# Patient Record
Sex: Female | Born: 1950 | Race: White | Hispanic: No | Smoking: Former smoker
Health system: Southern US, Community
[De-identification: ages and names within clinical notes are randomized; demographics above are authoritative.]

## PROBLEM LIST (undated history)

## (undated) DIAGNOSIS — G8929 Other chronic pain: Secondary | ICD-10-CM

## (undated) DIAGNOSIS — K589 Irritable bowel syndrome without diarrhea: Secondary | ICD-10-CM

## (undated) DIAGNOSIS — K219 Gastro-esophageal reflux disease without esophagitis: Secondary | ICD-10-CM

## (undated) DIAGNOSIS — F419 Anxiety disorder, unspecified: Secondary | ICD-10-CM

## (undated) DIAGNOSIS — I10 Essential (primary) hypertension: Secondary | ICD-10-CM

## (undated) DIAGNOSIS — F988 Other specified behavioral and emotional disorders with onset usually occurring in childhood and adolescence: Secondary | ICD-10-CM

## (undated) DIAGNOSIS — R51 Headache: Secondary | ICD-10-CM

## (undated) DIAGNOSIS — R519 Headache, unspecified: Secondary | ICD-10-CM

## (undated) DIAGNOSIS — N3010 Interstitial cystitis (chronic) without hematuria: Secondary | ICD-10-CM

## (undated) DIAGNOSIS — D649 Anemia, unspecified: Secondary | ICD-10-CM

## (undated) DIAGNOSIS — E785 Hyperlipidemia, unspecified: Secondary | ICD-10-CM

## (undated) DIAGNOSIS — F329 Major depressive disorder, single episode, unspecified: Secondary | ICD-10-CM

## (undated) DIAGNOSIS — N390 Urinary tract infection, site not specified: Secondary | ICD-10-CM

## (undated) DIAGNOSIS — F32A Depression, unspecified: Secondary | ICD-10-CM

## (undated) DIAGNOSIS — F429 Obsessive-compulsive disorder, unspecified: Secondary | ICD-10-CM

## (undated) DIAGNOSIS — Z8659 Personal history of other mental and behavioral disorders: Secondary | ICD-10-CM

## (undated) DIAGNOSIS — J189 Pneumonia, unspecified organism: Secondary | ICD-10-CM

## (undated) HISTORY — DX: Interstitial cystitis (chronic) without hematuria: N30.10

## (undated) HISTORY — DX: Essential (primary) hypertension: I10

## (undated) HISTORY — PX: OTHER SURGICAL HISTORY: SHX169

## (undated) HISTORY — DX: Hyperlipidemia, unspecified: E78.5

## (undated) HISTORY — DX: Gastro-esophageal reflux disease without esophagitis: K21.9

## (undated) HISTORY — DX: Other chronic pain: G89.29

## (undated) HISTORY — DX: Major depressive disorder, single episode, unspecified: F32.9

## (undated) HISTORY — DX: Other specified behavioral and emotional disorders with onset usually occurring in childhood and adolescence: F98.8

## (undated) HISTORY — DX: Obsessive-compulsive disorder, unspecified: F42.9

## (undated) HISTORY — DX: Headache: R51

## (undated) HISTORY — DX: Headache, unspecified: R51.9

## (undated) HISTORY — PX: RHINOPLASTY: SUR1284

## (undated) HISTORY — DX: Anemia, unspecified: D64.9

## (undated) HISTORY — PX: MENISCUS REPAIR: SHX5179

## (undated) HISTORY — DX: Depression, unspecified: F32.A

## (undated) HISTORY — DX: Anxiety disorder, unspecified: F41.9

## (undated) HISTORY — DX: Personal history of other mental and behavioral disorders: Z86.59

## (undated) HISTORY — DX: Irritable bowel syndrome, unspecified: K58.9

## (undated) HISTORY — PX: TONSILLECTOMY: SUR1361

## (undated) HISTORY — DX: Pneumonia, unspecified organism: J18.9

## (undated) HISTORY — PX: ADENOIDECTOMY: SUR15

## (undated) HISTORY — PX: OSTEOTOMY: SHX137

## (undated) HISTORY — DX: Urinary tract infection, site not specified: N39.0

---

## 1982-03-27 HISTORY — PX: OTHER SURGICAL HISTORY: SHX169

## 2003-01-21 ENCOUNTER — Inpatient Hospital Stay (HOSPITAL_COMMUNITY): Admission: EM | Admit: 2003-01-21 | Discharge: 2003-01-24 | Payer: Self-pay | Admitting: Psychiatry

## 2010-08-12 NOTE — Discharge Summary (Signed)
NAMESAMHITA, KRETSCH                            ACCOUNT NO.:  [id number]   MEDICAL RECORD NO.:  [id number]                   PATIENT TYPE:  IPS   LOCATION:  0302                                 FACILITY:  [facility]   PHYSICIAN:  Geoffery Lyons, M.D.                   DATE OF BIRTH:  08-19-1950   DATE OF ADMISSION:  01/21/2003  DATE OF DISCHARGE:  01/24/2003                                 DISCHARGE SUMMARY   CHIEF COMPLAINT AND PRESENT ILLNESS:  This was the first admission to [facility] for this 60 year old white female, married,  involuntarily committed.  She had significant family stressors.  She claimed  she lost it, yelling and screaming at her son for piercing his eyebrow,  having one or two months of irritability and anger, helplessness,  hopelessness, temper outbursts.  Her best friend died of a heart attack on  01-04-2023 of this same year, her son had ADHD, and she was the only bread  winner.   PAST PSYCHIATRIC HISTORY:  This was the first time inpatient.   SUBSTANCE ABUSE HISTORY:  One to two drinks daily, no escalation.   PAST MEDICAL HISTORY:  1. Perimenopausal.  2. Interstitial cystitis.   MEDICATIONS:  1. Elmiron two capsules twice a day.  2. Neurontin 300 mg twice a day.  3. Vistaril 25 mg every six hours as needed.  4. Valium 5 mg twice a day.  5. Pyridium.   PHYSICAL EXAMINATION:  Physical examination was performed, failed to show  any acute findings.   MENTAL STATUS EXAM:  Mental status exam revealed a fully alert, cooperative,  pleasant female, tearful, fragile.  Speech: Normal tone and pace.  Mood:  Depressed, helpless, hopeless.  Affect: Depressed.  Stressed by the conflict  with the husband and her son.  Thought processes: Suicidal ideation, not  clear, no plan; no homicidal or suicidal ideas, no psychosis.  Good insight.  Judgment: Appropriate, intact.  Cognitive: Cognition was well preserved.   ADMISSION DIAGNOSES:   AXIS I:   Major depression, recurrent.   AXIS II:  No diagnosis.   AXIS III:  Interstitial cystis.   AXIS IV:  Moderate.   AXIS V:  Global assessment of functioning upon admission 38, highest global  assessment of functioning in the last year 75.   LABORATORY DATA:  CBC was within normal limits.  Blood chemistries were  within normal limits.  Thyroid profile was within normal limits.  Drug  screen: Positive for benzodiazepines.   HOSPITAL COURSE:  She was admitted and started in intensive individual and  group psychotherapy.  She was maintained on Ambien and she was maintained on  the Valium 5 mg twice a day as needed for anxiety.  She was started on  Lexapro 10 mg per day, had already started on Neurontin 300 mg twice a day  and Elmiron 100 mg two  twice a day.  She admitted to having been dealing  with multiple stressors, had been building up and lost it, more overwhelmed,  pressured, her job as a Education officer, museum, son with ADHD, acting out,  impulsive, chronic medical conditions.  There was a session with the  husband.  He admitted that they have financial stress due to back taxes.  Husband was in the pottery business but it was very slow.  On October 30,  she was in full contact with reality, encouraged by the way the session went  with the husband, no suicidal ideas, no homicidal ideas, no delusions, no  hallucinations, willing to continue to work on coping skills and stress  management, feeling more positive about her life.   DISCHARGE DIAGNOSES:   AXIS I:  Major depression, recurrent.   AXIS II:  No diagnosis.   AXIS III:  Interstitial cystitis.   AXIS IV:  Moderate.   AXIS V:  Global assessment of functioning upon discharge 55.   DISCHARGE MEDICATIONS:  1. Lexapro 10 mg per day.  2. Neurontin 300 mg twice a day.  3. Elmiron 100 mg two twice a day.  4. Valium 5 mg twice a day.  5. Ambien 10 mg at bedtime as needed for sleep.  6. Detrol LA 4 mg daily.  7. Urised one  tablet daily.   FOLLOW UP:  She was to follow up at [facility].                                               Geoffery Lyons, M.D.    IL/MEDQ  D:  02/18/2003  T:  02/19/2003  Job:  [id number]

## 2013-04-09 ENCOUNTER — Telehealth: Payer: Self-pay

## 2013-04-09 NOTE — Telephone Encounter (Signed)
Dr.White called the office.DOD call for Dr.Smith.pt schedule to be seen on 04/10/13 @9am  to discuss cardiac symptoms and possible cardiac cath.pt aware of appt

## 2013-04-10 ENCOUNTER — Encounter: Payer: Self-pay | Admitting: Interventional Cardiology

## 2013-04-10 ENCOUNTER — Ambulatory Visit (INDEPENDENT_AMBULATORY_CARE_PROVIDER_SITE_OTHER): Payer: BC Managed Care – PPO | Admitting: Interventional Cardiology

## 2013-04-10 VITALS — BP 130/90 | HR 82 | Ht 65.5 in | Wt 183.0 lb

## 2013-04-10 DIAGNOSIS — E785 Hyperlipidemia, unspecified: Secondary | ICD-10-CM

## 2013-04-10 DIAGNOSIS — R079 Chest pain, unspecified: Secondary | ICD-10-CM

## 2013-04-10 DIAGNOSIS — F329 Major depressive disorder, single episode, unspecified: Secondary | ICD-10-CM

## 2013-04-10 DIAGNOSIS — K219 Gastro-esophageal reflux disease without esophagitis: Secondary | ICD-10-CM

## 2013-04-10 MED ORDER — ROSUVASTATIN CALCIUM 20 MG PO TABS: 20.0000 mg | ORAL_TABLET | Freq: Every day | ORAL | Status: DC

## 2013-04-10 MED ORDER — ASPIRIN EC 81 MG PO TBEC: 81.0000 mg | DELAYED_RELEASE_TABLET | Freq: Every day | ORAL | Status: DC

## 2013-04-10 NOTE — Patient Instructions (Signed)
Start Crestor 20mg  every evening with dinner  Start Asprin 81mg  daily  Your physician has requested that you have en exercise stress myoview. For further information please visit [email or web address]. Please follow instruction sheet, as given.( To be scheduled asap)  You have been instructed on how and when to use Nitroglycerin  Call the if if chest pain reoccurs. If chest pain is persisted go to the Emergency Dept

## 2013-04-10 NOTE — Progress Notes (Signed)
Patient ID: Margaret Daniels, female   DOB: 10-03-1950, 63 y.o.   MRN: [id number]   Date: 04/10/2013 ID: Margaret Daniels, DOB 10-03-1950, MRN [id number] PCP: Everlean CherryWHYTE,THOMAS M, MD  Reason: Chest pain of anginal quality  ASSESSMENT;  1. Chest pressure, of anginal quality occurring at rest. Episode lasted less than 20 minutes. EKG at primary physician's office yesterday demonstrated T-wave inversion the one from the free. 2. History of gastroesophageal reflux 3. Hypercholesterolemia 4. History of depression and anxiety 5. T-wave abnormality on electrocardiogram V1 through V4 representing either baseline tracing versus ischemia  PLAN:  1. Stress Cardiolite study to risk stratify this patient who has had one chest pain episode of less than 20 minutes compatible with angina. Baseline EKG performed by her primary physician yesterday demonstrated T-wave inversion V1 through V4. I am concerned about the patient's history. Under the circumstances with chest pain lasting less than 20 minutes, catheterization seems too aggressive. I do want to perform a high yield study to exclude the possibility of myocardial ischemia in the LAD territory. Because of the EKG abnormalities and the history I will prefer to perform a myocardial perfusion study rather than a regular exercise treadmill test to ensure that we will not miss a significant problem in the LAD territory. 2. Start statin therapy in the form of Crestor 20 mg per day 3. Aspirin 81 mg per day 4. Sublingual nitroglycerin if recurrent chest discomfort   SUBJECTIVE: Margaret Daniels is a 63 y.o. 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 5 days ago while sitting at home she developed a sudden viselike pressure in her chest that radiated to her left jaw. There was mild diaphoresis. The discomfort intensity was quite significant and rated as 7/10. The discomfort lasted between 10 and 20 minutes before slowly resolving spontaneously.  Since that time she has had no recurrence. She mentioned this episode her primary physician yesterday who had her work in today for evaluation. Since the episode occurred 5 days ago she has had no recurrence. She has had no limitations in physical activity. She is not short of breath. There've been no palpitations. Overall she is felt relatively well and her endurance has not changed   Allergies  Allergen Reactions  . Codeine     Feeling that she can't breathe    No current outpatient prescriptions on file prior to visit.   No current facility-administered medications on file prior to visit.    Past Medical History  Diagnosis Date  . Obsessive compulsive disorder   . Esophageal reflux   . Chronic interstitial cystitis   . History of major depression   . Hyperlipidemia     Past Surgical History  Procedure Laterality Date  . Cesarean section    . Palpable fracture repair    . Tonsillectomy      History   Social History  . Marital Status: Single    Spouse Name: N/A    Number of Children: N/A  . Years of Education: N/A   Occupational History  . Not on file.   Social History Main Topics  . Smoking status: Former Games developermoker  . Smokeless tobacco: Not on file  . Alcohol Use: No  . Drug Use: No  . Sexual Activity: Not on file   Other Topics Concern  . Not on file   Social History Narrative  . No narrative on file    Family History  Problem Relation Age of Onset  . Pancreatic cancer Mother   .  Kidney failure Father   . Breast cancer Sister     ROS: Complains of feeling under stress and having financial difficulties.. Other systems negative for complaints.  OBJECTIVE: BP 130/90  Pulse 82  Ht 5' 5.5" (1.664 m)  Wt 183 lb (83.008 kg)  BMI 29.98 kg/m2,  General: No acute distress, moderate obesity  HEENT: normal  Neck: JVD flat. Carotids absent  Chest: Clear  Cardiac: Murmur: 1 of 6 systolic murmur at the apex. Gallop: S4 gallop is audible. Rhythm: Regular.  Other: No other abnormality  Abdomen: Bruit: Absent. Pulsation: Absent  Extremities: Edema: Absent. Pulses: 2+ bilateral  Neuro: Normal  Psych: Appears normally compensated  ECG: ECG today demonstrates normal sinus rhythm with T wave flattening throughout the precordium which is slightly improved compared to the EKG performed at her primary physician's office 18 hours ago. EKG today is closer to a nonspecific pattern rather than an ischemic pattern.

## 2013-04-11 ENCOUNTER — Ambulatory Visit (HOSPITAL_COMMUNITY)
Admission: RE | Admit: 2013-04-11 | Discharge: 2013-04-11 | Disposition: A | Discharge status: Home / Self Care | Payer: BC Managed Care – PPO | Source: Ambulatory Visit | Attending: Cardiovascular Disease | Admitting: Cardiovascular Disease

## 2013-04-11 DIAGNOSIS — R079 Chest pain, unspecified: Secondary | ICD-10-CM | POA: Insufficient documentation

## 2013-04-11 MED ORDER — TECHNETIUM TC 99M SESTAMIBI GENERIC - CARDIOLITE
10.3000 | Freq: Once | INTRAVENOUS | Status: AC | PRN
  Administered 2013-04-11: 10 via INTRAVENOUS

## 2013-04-11 MED ORDER — TECHNETIUM TC 99M SESTAMIBI GENERIC - CARDIOLITE
31.1000 | Freq: Once | INTRAVENOUS | Status: AC | PRN
  Administered 2013-04-11: 31.1 via INTRAVENOUS

## 2013-04-11 NOTE — Procedures (Addendum)
[facility] [place] [facility] [place] [facility] [address] [place] [address] [phone]  Cardiology Nuclear Med Study  Margaret Daniels is a 63 y.o. female     MRN : [id number]     DOB: 1950/06/04  Procedure Date: 04/11/2013  Nuclear Med Background Indication for Stress Test:  Evaluation for Ischemia History:  Emphysema Cardiac Risk Factors: History of Smoking, Lipids and Obesity  Symptoms:  Chest Pain and Fatigue   Nuclear Pre-Procedure Caffeine/Decaff Intake:  7:00pm NPO After: 5:00am   IV Site: R Antecubital  IV 0.9% NS with Angio Cath:  22g  Chest Size (in):  n/a IV Started by: Emmit PomfretAmanda Hicks, RN  Height: 5\' 6"  (1.676 m)  Cup Size: D  BMI:  Body mass index is 29.55 kg/(m^2). Weight:  183 lb (83.008 kg)   Tech Comments:  n/a    Nuclear Med Study 1 or 2 day study: 1 day  Stress Test Type:  Stress  Order Authorizing Provider:  Verdis PrimeHenry Smith, MD   Resting Radionuclide: Technetium 6719m Sestamibi  Resting Radionuclide Dose: 10.2 mCi   Stress Radionuclide:  Technetium 9219m Sestamibi  Stress Radionuclide Dose: 31.1 mCi           Stress Protocol Rest HR: 68 Stress HR: 153  Rest BP: 139/94 Stress BP: 209/98  Exercise Time (min): 8 METS: 10.1   Predicted Max HR: 158 bpm % Max HR: 96.84 bpm Rate Pressure Product: [id number]  Dose of Adenosine (mg):  n/a Dose of Lexiscan: n/a mg  Dose of Atropine (mg): n/a Dose of Dobutamine: n/a mcg/kg/min (at max HR)  Stress Test Technologist: Esperanza Sheetserry-Marie Martin, CCT Nuclear Technologist: Gonzella LexPam Phillips, CNMT   Rest Procedure:  Myocardial perfusion imaging was performed at rest 45 minutes following the intravenous administration of Technetium 7219m Sestamibi. Stress Procedure:  The patient performed treadmill exercise using a Bruce  Protocol for 8 minutes. The patient stopped due to SOB and fatigue and denied any chest pain.  There were no significant ST-T wave changes.  Technetium 119m Sestamibi was injected at peak exercise and  myocardial perfusion imaging was performed after a brief delay.  Transient Ischemic Dilatation (Normal <1.22):  0.75 Lung/Heart Ratio (Normal <0.45):  0.30 QGS EDV:  83 ml QGS ESV:  29 ml LV Ejection Fraction: 65%  Rest ECG: NSR - Normal EKG  Stress ECG: No significant ST segment change suggestive of ischemia.  QPS Raw Data Images:  There is interference from nuclear activity from structures below the diaphragm. This does not affect the ability to read the study. Stress Images:  Normal homogeneous uptake in all areas of the myocardium. Rest Images:  Normal homogeneous uptake in all areas of the myocardium. Subtraction (SDS):  No evidence of ischemia.  Impression Exercise Capacity:  Good exercise capacity. BP Response:  Normal blood pressure response. Clinical Symptoms:  No significant symptoms noted. ECG Impression:  No significant ST segment change suggestive of ischemia. Comparison with Prior Nuclear Study: No images to compare  Overall Impression:  Normal stress nuclear study. THR achieved. Good exercise tolerance. Hypertensive response to exercise.  LV Wall Motion:  NL LV Function; NL Wall Motion; EF 65%.  Chrystie NoseKenneth C. Daulton Harbaugh, MD, [facility] LLCFACC Board Certified in Nuclear Cardiology Attending Cardiologist [facility]  Chrystie NoseHILTY,[name], MD  04/11/2013 12:35 PM

## 2013-04-15 ENCOUNTER — Telehealth: Payer: Self-pay

## 2013-04-15 NOTE — Telephone Encounter (Signed)
pt given results of nuclear stress test.Let patient know that the stress test was normal. If there are recurrent symptoms, she should call immediately. Stay on medicine for cholesterol and baby aspirin. F/U with PCP ASAP to monitor cholesterol.results fwd to Dr.Whyte.pt verbalized understanding.

## 2013-04-15 NOTE — Telephone Encounter (Signed)
Message copied by Jarvis NewcomerPARRIS-GODLEY, Kayla Deshaies S on Tue Apr 15, 2013  3:47 PM ------      Message from: Verdis PrimeSMITH, HENRY      Created: Tue Apr 15, 2013  7:39 AM       Let patient know that the stress test was normal. If there are recurrent symptoms, she should call immediately. Stay on medicine for cholesterol and baby aspirin. F/U with PCP ASAP to monitor cholesterol.end copy of nuclear study to PCP. ------

## 2013-06-02 ENCOUNTER — Other Ambulatory Visit (HOSPITAL_COMMUNITY): Payer: Self-pay | Admitting: Psychiatry

## 2014-03-27 NOTE — Telephone Encounter (Signed)
Refill not appropriate. Not seen in outpatient. Cleaning out MD inbasket, request was faxed in March when request was sent.

## 2016-08-24 HISTORY — PX: COLONOSCOPY: SHX174

## 2016-09-25 ENCOUNTER — Other Ambulatory Visit: Payer: Self-pay | Admitting: Family Medicine

## 2016-09-25 DIAGNOSIS — R928 Other abnormal and inconclusive findings on diagnostic imaging of breast: Secondary | ICD-10-CM

## 2016-10-04 ENCOUNTER — Other Ambulatory Visit: Payer: Self-pay | Admitting: Family Medicine

## 2016-10-04 ENCOUNTER — Ambulatory Visit
Admission: RE | Admit: 2016-10-04 | Discharge: 2016-10-04 | Disposition: A | Discharge status: Home / Self Care | Payer: Medicare Other | Source: Ambulatory Visit | Attending: Family Medicine | Admitting: Family Medicine

## 2016-10-04 DIAGNOSIS — N63 Unspecified lump in unspecified breast: Secondary | ICD-10-CM

## 2016-10-04 DIAGNOSIS — R928 Other abnormal and inconclusive findings on diagnostic imaging of breast: Secondary | ICD-10-CM

## 2016-10-19 HISTORY — PX: ESOPHAGOGASTRODUODENOSCOPY: SHX1529

## 2016-10-25 HISTORY — PX: BREAST LUMPECTOMY: SHX2

## 2017-01-25 HISTORY — PX: APPENDECTOMY: SHX54

## 2017-10-25 ENCOUNTER — Encounter: Payer: Self-pay | Admitting: Gastroenterology

## 2017-12-03 ENCOUNTER — Encounter: Payer: Self-pay | Admitting: Gastroenterology

## 2017-12-28 ENCOUNTER — Encounter: Payer: Self-pay | Admitting: Gastroenterology

## 2017-12-31 ENCOUNTER — Ambulatory Visit: Payer: Medicare Other | Admitting: Gastroenterology

## 2017-12-31 ENCOUNTER — Encounter: Payer: Self-pay | Admitting: Gastroenterology

## 2017-12-31 ENCOUNTER — Other Ambulatory Visit (INDEPENDENT_AMBULATORY_CARE_PROVIDER_SITE_OTHER): Payer: Medicare Other

## 2017-12-31 VITALS — BP 146/88 | HR 80 | Ht 65.0 in | Wt 182.4 lb

## 2017-12-31 DIAGNOSIS — R1032 Left lower quadrant pain: Secondary | ICD-10-CM | POA: Diagnosis not present

## 2017-12-31 DIAGNOSIS — K580 Irritable bowel syndrome with diarrhea: Secondary | ICD-10-CM

## 2017-12-31 MED ORDER — DICYCLOMINE HCL 20 MG PO TABS: 20.0000 mg | ORAL_TABLET | Freq: Two times a day (BID) | ORAL | 3 refills | Status: DC

## 2017-12-31 NOTE — Patient Instructions (Signed)
If you are age 67 or older, your body mass index should be between 23-30. Your Body mass index is 30.35 kg/m. If this is out of the aforementioned range listed, please consider follow up with your Primary Care Provider.  If you are age 71 or younger, your body mass index should be between 19-25. Your Body mass index is 30.35 kg/m. If this is out of the aformentioned range listed, please consider follow up with your Primary Care Provider.   You have been scheduled for a CT scan of the abdomen and pelvis at [place].   You are scheduled on 01/08/18 at [facility] should arrive 15 minutes prior to your appointment time for registration. Please follow the written instructions below on the day of your exam:  WARNING: IF YOU ARE ALLERGIC TO IODINE/X-RAY DYE, PLEASE NOTIFY RADIOLOGY IMMEDIATELY AT [phone]! YOU WILL BE GIVEN A 13 HOUR PREMEDICATION PREP.  1) Do not eat or drink anything after 5am (4 hours prior to your test) 2) You have been given 2 bottles of oral contrast to drink. The solution may taste better if refrigerated, but do NOT add ice or any other liquid to this solution. Shake well before drinking.    Drink 1 bottle of contrast @ 7am (2 hours prior to your exam)  Drink 1 bottle of contrast @ 8am (1 hour prior to your exam)  You may take any medications as prescribed with a small amount of water, if necessary. If you take any of the following medications: METFORMIN, GLUCOPHAGE, GLUCOVANCE, AVANDAMET, RIOMET, FORTAMET, [place] MET, JANUMET, GLUMETZA or METAGLIP, you MAY be asked to HOLD this medication 48 hours AFTER the exam.  The purpose of you drinking the oral contrast is to aid in the visualization of your intestinal tract. The contrast solution may cause some diarrhea. Depending on your individual set of symptoms, you may also receive an intravenous injection of x-ray contrast/dye. Plan on being at [facility] for 30 minutes or longer, depending on the type of  exam you are having performed.  This test typically takes 30-45 minutes to complete.  If you have any questions regarding your exam or if you need to reschedule, you may call the CT department at [phone] between the hours of 8:00 am and 5:00 pm, Monday-Friday.  ______________________________________________________________________  We have sent the following medications to your pharmacy for you to pick up at your convenience: Bentyl 20 mg twice daily.   Please go to the lab on the 2nd floor [address] before you leave the office today.   Please try a gluten free diet x 4 weeks.   Gluten-Free Diet for Celiac Disease, Adult The gluten-free diet includes all foods that do not contain gluten. Gluten is a protein that is found in wheat, rye, barley, and some other grains. Following the gluten-free diet is the only treatment for people with celiac disease. It helps to prevent damage to the intestines and improves or eliminates the symptoms of celiac disease. Following the gluten-free diet requires some planning. It can be challenging at first, but it gets easier with time and practice. There are more gluten-free options available today than ever before. If you need help finding gluten-free foods or if you have questions, talk with your diet and nutrition specialist (registered dietitian) or your health care provider. What do I need to know about a gluten-free diet?  All fruits, vegetables, and meats are safe to eat and do not contain gluten.  When grocery shopping,  start by shopping in the produce, meat, and dairy sections. These sections are more likely to contain gluten-free foods. Then move to the aisles that contain packaged foods if you need to.  Read all food labels. Gluten is often added to foods. Always check the ingredient list and look for warnings, such as "may contain gluten."  Talk with your dietitian or health care provider before taking a gluten-free multivitamin or mineral  supplement.  Be aware of gluten-free foods having contact with foods that contain gluten (cross-contamination). This can happen at home and with any processed foods. ? Talk with your health care provider or dietitian about how to reduce the risk of cross-contamination in your home. ? If you have questions about how a food is processed, ask the manufacturer. What key words help to identify gluten? Foods that list any of these key words on the label usually contain gluten:  Wheat, flour, enriched flour, bromated flour, white flour, durum flour, graham flour, phosphated flour, self-rising flour, semolina, farina, barley (malt), rye, and oats.  Starch, dextrin, modified food starch, or cereal.  Thickening, fillers, or emulsifiers.  Malt flavoring, malt extract, or malt syrup.  Hydrolyzed vegetable protein.  In the [place] packaged foods that are gluten-free are required to be labeled "GF." These foods should be easy to identify and are safe to eat. In the [place] food companies are also required to list common food allergens, including wheat, on their labels. Recommended foods Grains  Amaranth, bean flours, 100% buckwheat flour, corn, millet, nut flours or nut meals, GF oats, quinoa, rice, sorghum, teff, rice wafers, pure cornmeal tortillas, popcorn, and hot cereals made from cornmeal. Hominy, rice, wild rice. Some Asian rice noodles or bean noodles. Arrowroot starch, corn bran, corn flour, corn germ, cornmeal, corn starch, potato flour, potato starch flour, and rice bran. Plain, brown, and sweet rice flours. Rice polish, soy flour, and tapioca starch. Vegetables  All plain fresh, frozen, and canned vegetables. Fruits  All plain fresh, frozen, canned, and dried fruits, and 100% fruit juices. Meats and other protein foods  All fresh beef, pork, poultry, fish, seafood, and eggs. Fish canned in water, oil, brine, or vegetable broth. Plain nuts and seeds, peanut butter. Some lunch meat and some  frankfurters. Dried beans, dried peas, and lentils. Dairy  Fresh plain, dry, evaporated, or condensed milk. Cream, butter, sour cream, whipping cream, and most yogurts. Unprocessed cheese, most processed cheeses, some cottage cheese, some cream cheeses. Beverages  Coffee, tea, most herbal teas. Carbonated beverages and some root beers. Wine, sake, and distilled spirits, such as gin, vodka, and whiskey. Most hard ciders. Fats and oils  Butter, margarine, vegetable oil, hydrogenated butter, olive oil, shortening, lard, cream, and some mayonnaise. Some commercial salad dressings. Olives. Sweets and desserts  Sugar, honey, some syrups, molasses, jelly, and jam. Plain hard candy, marshmallows, and gumdrops. Pure cocoa powder. Plain chocolate. Custard and some pudding mixes. Gelatin desserts, sorbets, frozen ice pops, and sherbet. Cake, cookies, and other desserts prepared with allowed flours. Some commercial ice creams. Cornstarch, tapioca, and rice puddings. Seasoning and other foods  Some canned or frozen soups. Monosodium glutamate (MSG). Cider, rice, and wine vinegar. Baking soda and baking powder. Cream of tartar. Baking and nutritional yeast. Certain soy sauces made without wheat (ask your dietitian about specific brands that are allowed). Nuts, coconut, and chocolate. Salt, pepper, herbs, spices, flavoring extracts, imitation or artificial flavorings, natural flavorings, and food colorings. Some medicines and supplements. Some lip glosses and other cosmetics. Rice  syrups. The items listed may not be a complete list. Talk with your dietitian about what dietary choices are best for you. Foods to avoid Grains  Barley, bran, bulgur, couscous, cracked wheat, [place], farro, graham, malt, matzo, semolina, wheat germ, and all wheat and rye cereals including spelt and kamut. Cereals containing malt as a flavoring, such as rice cereal. Noodles, spaghetti, macaroni, most packaged rice mixes, and all mixes  containing wheat, rye, barley, or triticale. Vegetables  Most creamed vegetables and most vegetables canned in sauces. Some commercially prepared vegetables and salads. Fruits  Thickened or prepared fruits and some pie fillings. Some fruit snacks and fruit roll-ups. Meats and other protein foods  Any meat or meat alternative containing wheat, rye, barley, or gluten stabilizers. These are often marinated or packaged meats and lunch meats. Bread-containing products, such as Swiss steak, croquettes, meatballs, and meatloaf. Most tuna canned in vegetable broth and [place] with hydrolyzed vegetable protein (HVP) injected as part of the basting. Seitan. Imitation fish. Eggs in sauces made from ingredients to avoid. Dairy  Commercial chocolate milk drinks and malted milk. Some non-dairy creamers. Any cheese product containing ingredients to avoid. Beverages  Certain cereal beverages. Beer, ale, malted milk, and some root beers. Some hard ciders. Some instant flavored coffees. Some herbal teas made with barley or with barley malt added. Fats and oils  Some commercial salad dressings. Sour cream containing modified food starch. Sweets and desserts  Some toffees. Chocolate-coated nuts (may be rolled in wheat flour) and some commercial candies and candy bars. Most cakes, cookies, donuts, pastries, and other baked goods. Some commercial ice cream. Ice cream cones. Commercially prepared mixes for cakes, cookies, and other desserts. Bread pudding and other puddings thickened with flour. Products containing brown rice syrup made with barley malt enzyme. Desserts and sweets made with malt flavoring. Seasoning and other foods  Some curry powders, some dry seasoning mixes, some gravy extracts, some meat sauces, some ketchups, some prepared mustards, and horseradish. Certain soy sauces. Malt vinegar. Bouillon and bouillon cubes that contain HVP. Some chip dips, and some chewing gum. Yeast extract. Brewer's  yeast. Caramel color. Some medicines and supplements. Some lip glosses and other cosmetics. The items listed may not be a complete list. Talk with your dietitian about what dietary choices are best for you. Summary  Gluten is a protein that is found in wheat, rye, barley, and some other grains. The gluten-free diet includes all foods that do not contain gluten.  If you need help finding gluten-free foods or if you have questions, talk with your diet and nutrition specialist (registered dietitian) or your health care provider.  Read all food labels. Gluten is often added to foods. Always check the ingredient list and look for warnings, such as "may contain gluten." This information is not intended to replace advice given to you by your health care provider. Make sure you discuss any questions you have with your health care provider. Document Released: 03/13/2005 Document Revised: 12/27/2015 Document Reviewed: 12/27/2015 Elsevier Interactive Patient Education  2018 [employer].   Thank you,  Dr. Jackquline Denmark
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
## 2018-01-01 LAB — COMPREHENSIVE METABOLIC PANEL
ALK PHOS: 75 U/L (ref 39–117)
ALT: 20 U/L (ref 0–35)
AST: 19 U/L (ref 0–37)
Albumin: 4.6 g/dL (ref 3.5–5.2)
BUN: 12 mg/dL (ref 6–23)
CO2: 28 mEq/L (ref 19–32)
CREATININE: 0.91 mg/dL (ref 0.40–1.20)
Calcium: 10 mg/dL (ref 8.4–10.5)
Chloride: 97 mEq/L (ref 96–112)
GFR: 65.43 mL/min (ref >60.00)
Glucose, Bld: 94 mg/dL (ref 70–99)
POTASSIUM: 4.3 meq/L (ref 3.5–5.1)
SODIUM: 132 meq/L — AB (ref 135–145)
TOTAL PROTEIN: 7.1 g/dL (ref 6.0–8.3)
Total Bilirubin: 0.4 mg/dL (ref 0.2–1.2)

## 2018-01-01 LAB — CBC WITH DIFFERENTIAL/PLATELET
Basophils Absolute: 0 10*3/uL (ref 0.0–0.1)
Basophils Relative: 0.7 % (ref 0.0–3.0)
EOS ABS: 0.1 10*3/uL (ref 0.0–0.7)
Eosinophils Relative: 1.5 % (ref 0.0–5.0)
HCT: 41 % (ref 36.0–46.0)
HEMOGLOBIN: 13.8 g/dL (ref 12.0–15.0)
Lymphocytes Relative: 32.1 % (ref 12.0–46.0)
Lymphs Abs: 1.9 10*3/uL (ref 0.7–4.0)
MCHC: 33.6 g/dL (ref 30.0–36.0)
MCV: 95.9 fl (ref 78.0–100.0)
MONO ABS: 0.5 10*3/uL (ref 0.1–1.0)
Monocytes Relative: 7.6 % (ref 3.0–12.0)
Neutro Abs: 3.5 10*3/uL (ref 1.4–7.7)
Neutrophils Relative %: 58.1 % (ref 43.0–77.0)
Platelets: 324 10*3/uL (ref 150.0–400.0)
RBC: 4.27 Mil/uL (ref 3.87–5.11)
RDW: 12.5 % (ref 11.5–15.5)
WBC: 6 10*3/uL (ref 4.0–10.5)

## 2018-01-01 LAB — LIPASE: Lipase: 42 U/L (ref 11.0–59.0)

## 2018-01-01 LAB — TSH: TSH: 3.14 u[IU]/mL (ref 0.35–4.50)

## 2018-01-08 ENCOUNTER — Ambulatory Visit (HOSPITAL_BASED_OUTPATIENT_CLINIC_OR_DEPARTMENT_OTHER): Admission: RE | Admit: 2018-01-08 | Payer: Medicare Other | Source: Ambulatory Visit

## 2018-09-12 ENCOUNTER — Telehealth: Payer: Self-pay | Admitting: Gastroenterology

## 2018-09-12 NOTE — Telephone Encounter (Signed)
No CT needed prior to the appt.  Pt aware

## 2018-09-12 NOTE — Telephone Encounter (Signed)
Pt is scheduled for a telephone visit with Dr. Lyndel Safe July 1; pt would like to know whether Dr. Lyndel Safe would like her to have CT scan done before appt--if so, pt requested to pick up contrast with her PCP in [place] as she does not want to drive to [place].

## 2018-09-25 ENCOUNTER — Other Ambulatory Visit: Payer: Self-pay

## 2018-09-25 ENCOUNTER — Telehealth: Payer: Medicare Other | Admitting: Gastroenterology

## 2019-04-02 ENCOUNTER — Other Ambulatory Visit: Payer: Self-pay | Admitting: Gastroenterology

## 2019-04-02 DIAGNOSIS — K580 Irritable bowel syndrome with diarrhea: Secondary | ICD-10-CM

## 2019-04-02 DIAGNOSIS — R1032 Left lower quadrant pain: Secondary | ICD-10-CM

## 2019-04-20 IMAGING — MR MR BREAST BIOPSY
9 of 14 series · 33 of 48 positions shown · IV contrast (17ml Multihance)
Comparison: Previous exams.

ADDENDUM:
Pathology revealed adenosis and fibrocystic change with
calcifications in the RIGHT breast. This was found to be concordant
by Dr. Usman Ream. Pathology revealed an intraductal papilloma with
atypical ductal hyperplasia in the LEFT breast. Excision is
recommended. This was found to be concordant by Dr. Usman Ream.
Pathology results were discussed with the patient by telephone. The
patient reported doing well after the biopsies with tenderness at
the sites. Post biopsy instructions and care were reviewed and
questions were answered. The patient was encouraged to call The
patient had a prior RIGHT breast biopsy at [facility] which
revealed an intraductal papilloma. Excision is recommended for the
right and left papillomas. Dr. [REDACTED] was notified
and will make a surgical referral in [HOSPITAL].

Pathology results reported by Sal Masten RN, BSN on 10/06/2016.
CLINICAL DATA: 5 mm enhancing mass in the central left breast,
slightly medially and slightly superiorly on a recent screening MRI
for a strong family history of breast cancer.
EXAM:
MRI GUIDED CORE NEEDLE BIOPSY OF THE LEFT BREAST
TECHNIQUE: Multiplanar, multisequence MR imaging of the left breast was
performed both before and after administration of intravenous
contrast.
CONTRAST:  17 cc MultiHance

[Series 3: axial pre-cm · axial · non-contrast · 1.3mm · 1.04mm/px · z∈[-79,+107]mm · 4 of 144 slices shown]
[im 1/144]
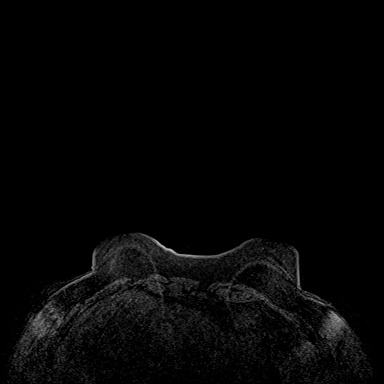
[im 48/144]
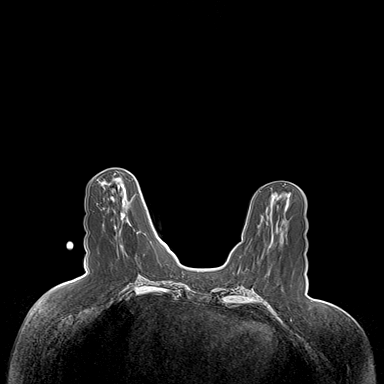
[im 96/144]
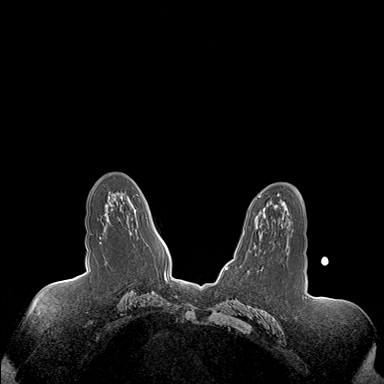
[im 144/144]
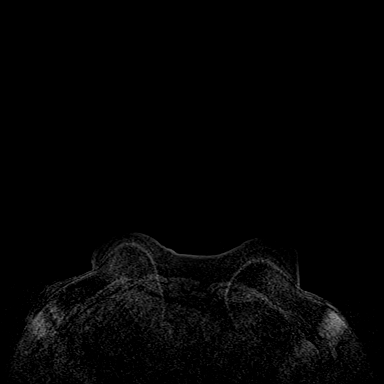

[Series 4: axial pre-cm_s3_(id) · axial · non-contrast · 1.3mm · 1.04mm/px · z∈[-79,+107]mm · 4 of 144 slices shown]
[im 1/144]
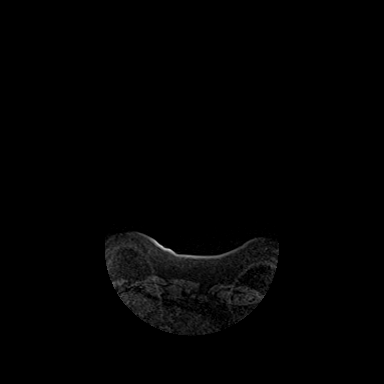
[im 48/144]
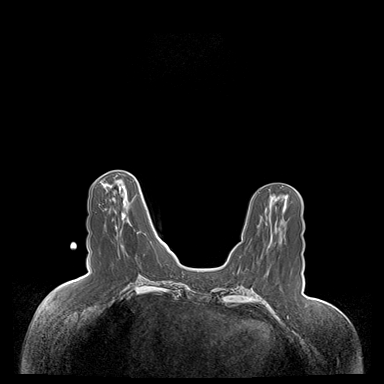
[im 96/144]
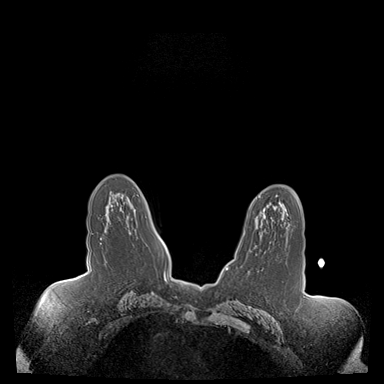
[im 144/144]
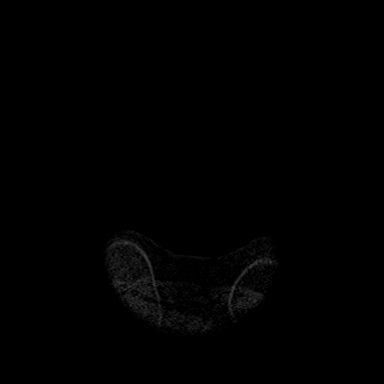

[Series 5: axial post 20 · axial · 1.3mm · 1.04mm/px · z∈[-79,+107]mm · 4 of 144 slices shown (1 of 3)]
[im 1/144]
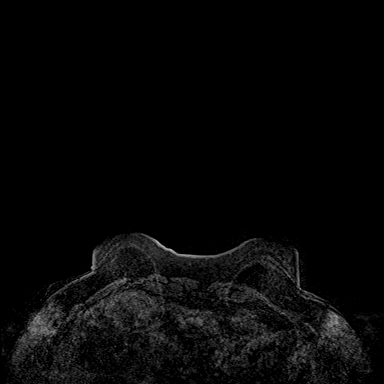
[im 48/144]
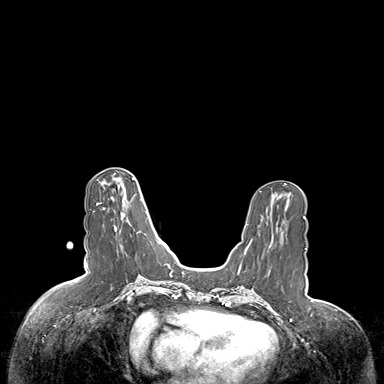
[im 96/144]
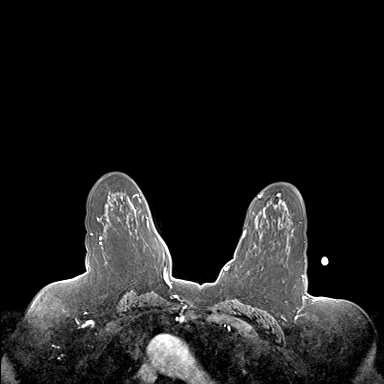
[im 144/144]
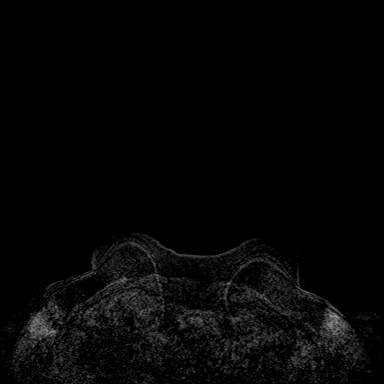

[Series 6: axial post 20 · axial · 1.3mm · 1.04mm/px · z∈[-79,+107]mm · 4 of 144 slices shown (2 of 3)]
[im 1/144]
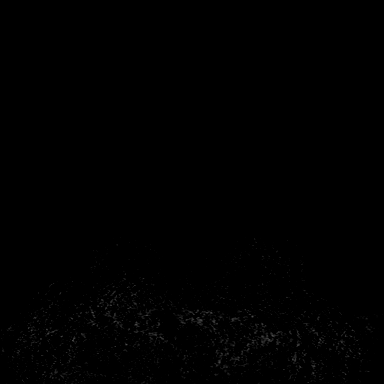
[im 48/144]
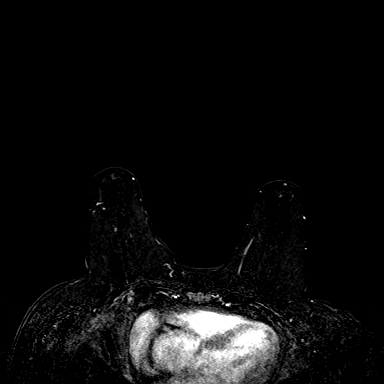
[im 96/144]
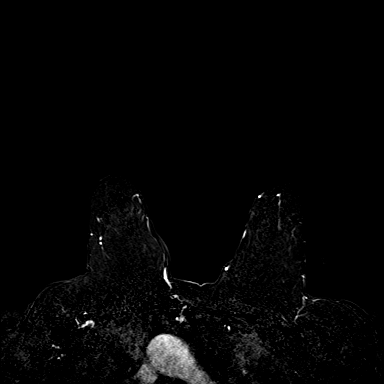
[im 144/144]
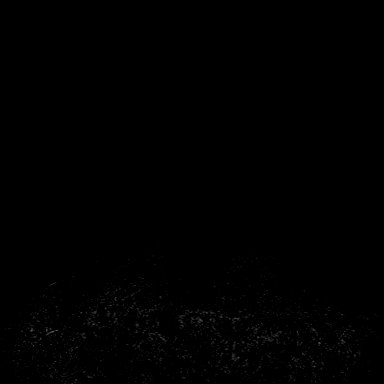

[Series 7: axial post 20 · axial · 1.3mm · 1.04mm/px · z∈[-79,+107]mm · 4 of 144 slices shown (3 of 3)]
[im 1/144]
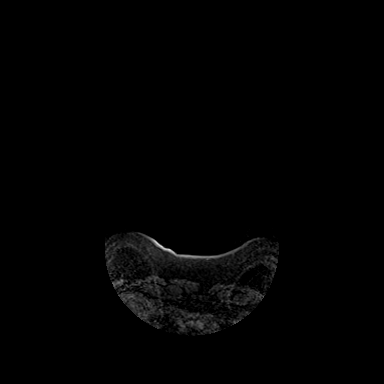
[im 48/144]
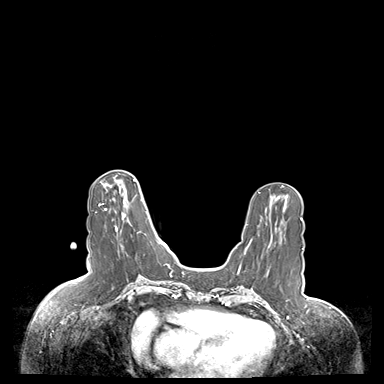
[im 96/144]
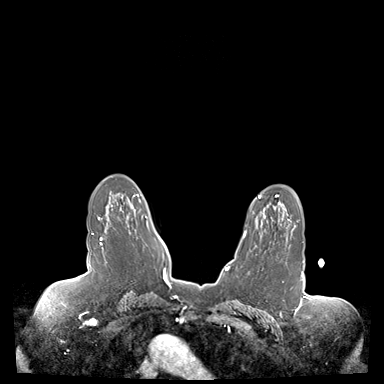
[im 144/144]
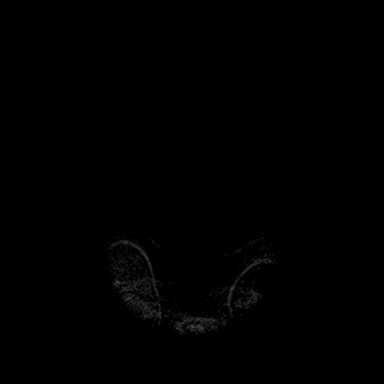

[Series 8: axial post 3 · axial · 1.3mm · 1.04mm/px · z∈[-79,+107]mm · 4 of 144 slices shown (1 of 3)]
[im 1/144]
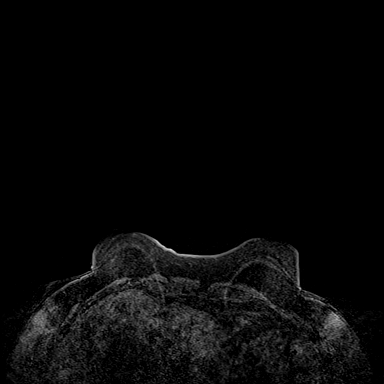
[im 48/144]
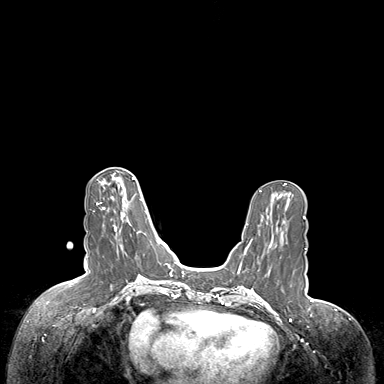
[im 96/144]
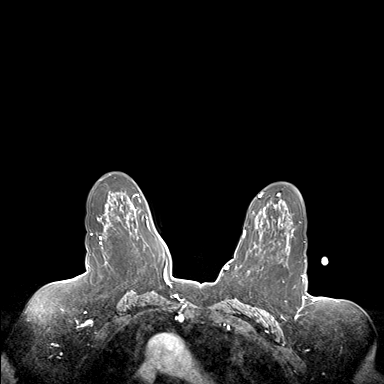
[im 144/144]
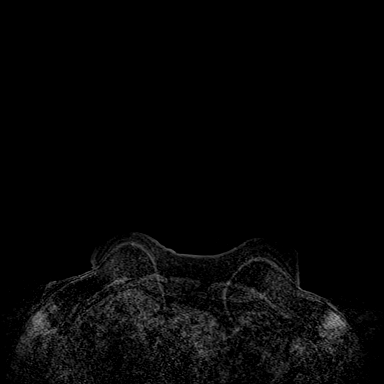

[Series 9: axial post 3 · axial · 1.3mm · 1.04mm/px · z∈[-79,+107]mm · 3 of 144 slices shown (2 of 3)]
[im 1/144]
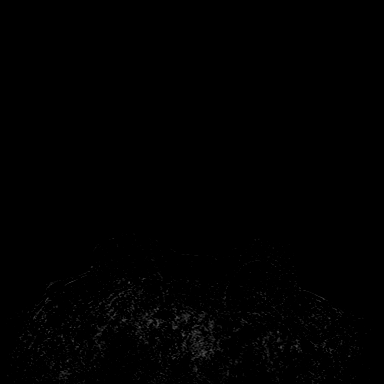
[im 72/144]
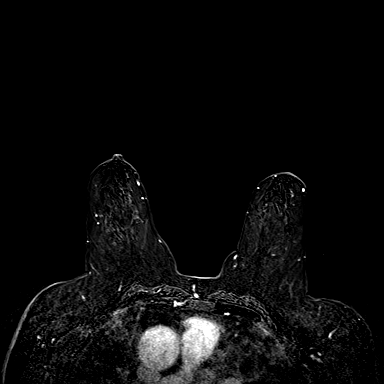
[im 144/144]
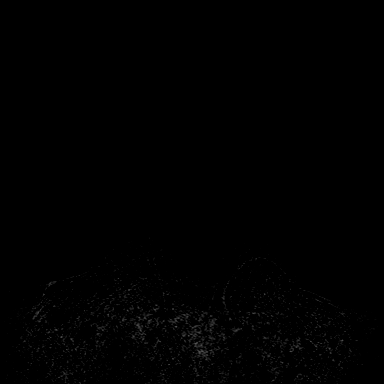

[Series 10: axial post 3 · axial · 1.3mm · 1.04mm/px · z∈[-79,+107]mm · 3 of 144 slices shown (3 of 3)]
[im 1/144]
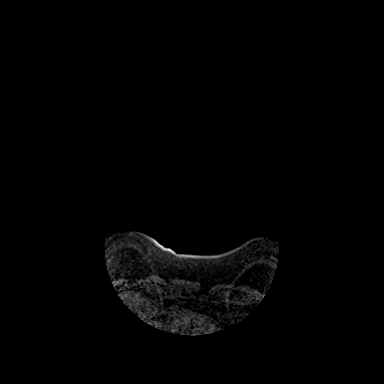
[im 72/144]
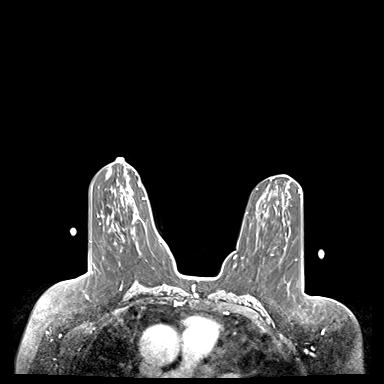
[im 144/144]
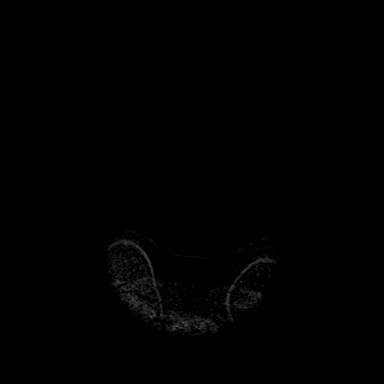

[Series 11: axial confirmation · axial · 1.3mm · 1.04mm/px · z∈[-79,+107]mm · 3 of 144 slices shown]
[im 1/144]
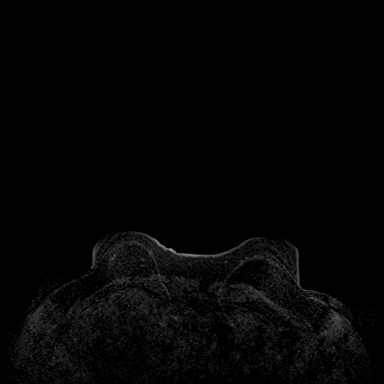
[im 72/144]
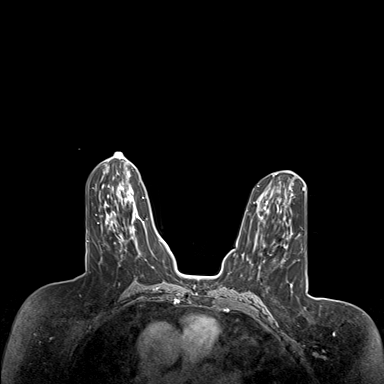
[im 144/144]
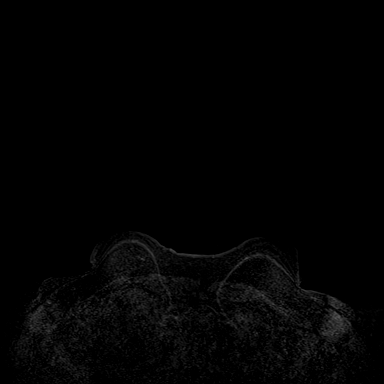

[33 of 48 positions shown; findings below may reference images not displayed]

FINDINGS: I met with the patient, and we discussed the procedure of MRI guided
biopsy, including risks, benefits, and alternatives. Specifically,
we discussed the risks of infection, bleeding, tissue injury, clip
migration, and inadequate sampling. Informed, written consent was
given. The usual time out protocol was performed immediately prior
to the procedure.

Using sterile technique, 1% Lidocaine, MRI guidance, and a 9 gauge
vacuum assisted device, biopsy was performed of the recently
demonstrated 5 mm enhancing mass in the central left breast,
slightly medially and slightly superiorly, using a lateral approach.
At the conclusion of the procedure, a dumbbell-shaped tissue marker
clip was deployed into the biopsy cavity. Follow-up 2-view mammogram
was performed and dictated separately.
IMPRESSION: MRI guided biopsy of a 5 mm enhancing mass in the central left
breast, slightly medially and slightly superiorly. No apparent
complications.

## 2019-07-02 ENCOUNTER — Other Ambulatory Visit: Payer: Self-pay

## 2019-07-02 ENCOUNTER — Ambulatory Visit (INDEPENDENT_AMBULATORY_CARE_PROVIDER_SITE_OTHER): Payer: Medicare PPO | Admitting: Allergy and Immunology

## 2019-07-02 ENCOUNTER — Encounter: Payer: Self-pay | Admitting: Allergy and Immunology

## 2019-07-02 VITALS — BP 132/82 | HR 86 | Temp 98.0°F | Resp 16 | Ht 63.5 in | Wt 192.8 lb

## 2019-07-02 DIAGNOSIS — K219 Gastro-esophageal reflux disease without esophagitis: Secondary | ICD-10-CM | POA: Diagnosis not present

## 2019-07-02 DIAGNOSIS — J3089 Other allergic rhinitis: Secondary | ICD-10-CM | POA: Diagnosis not present

## 2019-07-02 MED ORDER — OMEPRAZOLE 40 MG PO CPDR: DELAYED_RELEASE_CAPSULE | ORAL | 5 refills | Status: DC

## 2019-07-02 MED ORDER — FAMOTIDINE 40 MG PO TABS: ORAL_TABLET | ORAL | 5 refills | Status: AC

## 2019-07-02 NOTE — Patient Instructions (Addendum)
  1.  Allergen avoidance measures - dust mite, cat, dog  2.  Treat and prevent reflux:   A.   Eliminate chocolate consumption  B.   Consolidate caffeine consumption  C.   Consolidate alcohol consumption  D.   Eliminate fish oil use  E.   No late large meals  F.   Raise head of bed  G.  Omeprazole 40 mg 1 tablet twice a day  H.   Famotidine 40 mg 1 tablet in evening  3.  Return to clinic in 4 weeks or earlier if problem  4.  Further evaluation and treatment?
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
## 2019-07-03 ENCOUNTER — Encounter: Payer: Self-pay | Admitting: Allergy and Immunology

## 2019-07-07 ENCOUNTER — Encounter: Payer: Self-pay | Admitting: *Deleted

## 2019-07-19 DIAGNOSIS — K529 Noninfective gastroenteritis and colitis, unspecified: Secondary | ICD-10-CM

## 2019-07-19 DIAGNOSIS — K589 Irritable bowel syndrome without diarrhea: Secondary | ICD-10-CM

## 2019-07-19 DIAGNOSIS — E871 Hypo-osmolality and hyponatremia: Secondary | ICD-10-CM

## 2019-07-19 DIAGNOSIS — N179 Acute kidney failure, unspecified: Secondary | ICD-10-CM

## 2019-07-20 DIAGNOSIS — N179 Acute kidney failure, unspecified: Secondary | ICD-10-CM | POA: Diagnosis not present

## 2019-07-20 DIAGNOSIS — K529 Noninfective gastroenteritis and colitis, unspecified: Secondary | ICD-10-CM | POA: Diagnosis not present

## 2019-07-20 DIAGNOSIS — K589 Irritable bowel syndrome without diarrhea: Secondary | ICD-10-CM | POA: Diagnosis not present

## 2019-07-20 DIAGNOSIS — E871 Hypo-osmolality and hyponatremia: Secondary | ICD-10-CM | POA: Diagnosis not present

## 2019-07-21 ENCOUNTER — Encounter: Payer: Self-pay | Admitting: *Deleted

## 2019-07-21 DIAGNOSIS — N179 Acute kidney failure, unspecified: Secondary | ICD-10-CM | POA: Diagnosis not present

## 2019-07-21 DIAGNOSIS — E871 Hypo-osmolality and hyponatremia: Secondary | ICD-10-CM | POA: Diagnosis not present

## 2019-07-21 DIAGNOSIS — K529 Noninfective gastroenteritis and colitis, unspecified: Secondary | ICD-10-CM | POA: Diagnosis not present

## 2019-07-21 DIAGNOSIS — K589 Irritable bowel syndrome without diarrhea: Secondary | ICD-10-CM | POA: Diagnosis not present

## 2019-08-04 ENCOUNTER — Telehealth: Payer: Self-pay | Admitting: Gastroenterology

## 2019-08-04 NOTE — Telephone Encounter (Signed)
Would you like to refill this medication, patient has not been seen by you since 2019.

## 2019-08-05 NOTE — Telephone Encounter (Signed)
Please fill-in dicyclomine 10 mg p.o. twice daily, 60 with 6 refills Make a routine appointment  RG

## 2019-08-06 MED ORDER — DICYCLOMINE HCL 10 MG PO CAPS: 10.0000 mg | ORAL_CAPSULE | Freq: Two times a day (BID) | ORAL | 6 refills | Status: AC

## 2019-08-06 NOTE — Telephone Encounter (Signed)
I have sent prescription to patients pharmacy and left a message for her to return my call.

## 2019-09-09 ENCOUNTER — Ambulatory Visit: Payer: Medicare PPO | Admitting: Gastroenterology

## 2019-10-28 ENCOUNTER — Ambulatory Visit: Payer: Medicare PPO | Admitting: Gastroenterology

## 2020-03-19 ENCOUNTER — Other Ambulatory Visit: Payer: Self-pay | Admitting: Allergy and Immunology

## 2020-09-16 ENCOUNTER — Other Ambulatory Visit: Payer: Self-pay | Admitting: Allergy and Immunology

## 2020-10-16 ENCOUNTER — Other Ambulatory Visit: Payer: Self-pay | Admitting: Allergy and Immunology

## 2020-11-15 ENCOUNTER — Other Ambulatory Visit: Payer: Self-pay | Admitting: Allergy and Immunology

## 2021-03-25 ENCOUNTER — Other Ambulatory Visit: Payer: Self-pay | Admitting: Allergy and Immunology

## 2021-05-16 ENCOUNTER — Ambulatory Visit: Payer: Medicare PPO | Admitting: Allergy and Immunology
